# Patient Record
Sex: Female | Born: 1980 | Race: Black or African American | Hispanic: No | Marital: Single | Smoking: Current some day smoker
Health system: Southern US, Community
[De-identification: ages and names within clinical notes are randomized; demographics above are authoritative.]

## PROBLEM LIST (undated history)

## (undated) HISTORY — PX: ABDOMINAL HYSTERECTOMY: SHX81

---
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
## 1999-12-07 ENCOUNTER — Emergency Department (HOSPITAL_COMMUNITY): Admission: EM | Admit: 1999-12-07 | Discharge: 1999-12-07 | Payer: Self-pay | Admitting: Emergency Medicine

## 2000-01-07 ENCOUNTER — Emergency Department (HOSPITAL_COMMUNITY): Admission: EM | Admit: 2000-01-07 | Discharge: 2000-01-07 | Payer: Self-pay | Admitting: Emergency Medicine

## 2001-12-14 ENCOUNTER — Emergency Department (HOSPITAL_COMMUNITY): Admission: EM | Admit: 2001-12-14 | Discharge: 2001-12-15 | Payer: Self-pay | Admitting: Emergency Medicine

## 2005-01-28 ENCOUNTER — Emergency Department (HOSPITAL_COMMUNITY): Admission: EM | Admit: 2005-01-28 | Discharge: 2005-01-28 | Payer: Self-pay | Admitting: Emergency Medicine

## 2007-08-27 ENCOUNTER — Emergency Department (HOSPITAL_COMMUNITY): Admission: EM | Admit: 2007-08-27 | Discharge: 2007-08-27 | Payer: Self-pay | Admitting: Emergency Medicine

## 2008-03-21 ENCOUNTER — Emergency Department (HOSPITAL_COMMUNITY): Admission: EM | Admit: 2008-03-21 | Discharge: 2008-03-21 | Payer: Self-pay | Admitting: Family Medicine

## 2010-01-15 ENCOUNTER — Emergency Department (HOSPITAL_COMMUNITY): Admission: EM | Admit: 2010-01-15 | Discharge: 2010-01-15 | Payer: Self-pay | Admitting: Family Medicine

## 2011-02-19 LAB — POCT I-STAT, CHEM 8
BUN: 10
Calcium, Ion: 1.2
Chloride: 106
Creatinine, Ser: 1
Glucose, Bld: 100 — ABNORMAL HIGH
HCT: 46
Hemoglobin: 15.6 — ABNORMAL HIGH
Potassium: 4.2
Sodium: 140
TCO2: 24

## 2011-02-19 LAB — POCT PREGNANCY, URINE
Operator id: 239701
Preg Test, Ur: NEGATIVE

## 2011-02-25 LAB — POCT URINALYSIS DIP (DEVICE)
Glucose, UA: NEGATIVE
Ketones, ur: 80 — AB
Nitrite: NEGATIVE
Operator id: 235561
Protein, ur: 100 — AB
Specific Gravity, Urine: 1.015
Urobilinogen, UA: 0.2
pH: >=9

## 2011-02-25 LAB — POCT I-STAT, CHEM 8
BUN: 10
Calcium, Ion: 1.22
Chloride: 105
Creatinine, Ser: 1.1
Glucose, Bld: 101 — ABNORMAL HIGH
HCT: 45
Hemoglobin: 15.3 — ABNORMAL HIGH
Potassium: 4.4
Sodium: 137
TCO2: 24

## 2011-02-25 LAB — POCT PREGNANCY, URINE: Preg Test, Ur: NEGATIVE

## 2011-08-10 ENCOUNTER — Emergency Department (INDEPENDENT_AMBULATORY_CARE_PROVIDER_SITE_OTHER)
Admission: EM | Admit: 2011-08-10 | Discharge: 2011-08-10 | Disposition: A | Discharge status: Home / Self Care | Payer: Self-pay | Source: Home / Self Care | Attending: Emergency Medicine | Admitting: Emergency Medicine

## 2011-08-10 ENCOUNTER — Encounter (HOSPITAL_COMMUNITY): Payer: Self-pay

## 2011-08-10 DIAGNOSIS — J40 Bronchitis, not specified as acute or chronic: Secondary | ICD-10-CM

## 2011-08-10 DIAGNOSIS — R197 Diarrhea, unspecified: Secondary | ICD-10-CM

## 2011-08-10 MED ORDER — IBUPROFEN 800 MG PO TABS
ORAL_TABLET | ORAL | Status: AC
  Filled 2011-08-10: qty 1

## 2011-08-10 MED ORDER — AZITHROMYCIN 250 MG PO TABS: 250.0000 mg | ORAL_TABLET | Freq: Every day | ORAL | Status: AC

## 2011-08-10 MED ORDER — GUAIFENESIN-CODEINE 100-10 MG/5ML PO SYRP: 5.0000 mL | ORAL_SOLUTION | Freq: Three times a day (TID) | ORAL | Status: AC | PRN

## 2011-08-10 MED ORDER — IBUPROFEN 800 MG PO TABS
800.0000 mg | ORAL_TABLET | Freq: Once | ORAL | Status: AC
  Administered 2011-08-10: 800 mg via ORAL

## 2011-08-10 NOTE — Discharge Instructions (Signed)

## 2011-08-10 NOTE — ED Notes (Signed)
Pt states she has had a fever, chills, cough. Pt states she has taken Nyquil and rubbing vicks on her chest. Temp currently is 101.8

## 2011-08-10 NOTE — ED Provider Notes (Signed)
History     CSN: [id number]  Arrival date & time 08/10/11  1001   First MD Initiated Contact with Patient 08/10/11 1004      Chief Complaint  Patient presents with  . Fever    (Consider location/radiation/quality/duration/timing/severity/associated sxs/prior treatment) HPI Comments: As you have been having respiratory symptoms consistent of cough fever with associated chills for about one week been also present within the last 2 days some productive cough with colored sputum. Patient denies any gastrointestinal symptoms with the exception of 2 loose stools but no active vomiting, no shortness of breath  Patient is a 31 y.o. female presenting with fever. The history is provided by the patient.  Fever Primary symptoms of the febrile illness include fever and cough. Primary symptoms do not include shortness of breath or rash. This is a new problem.    History reviewed. No pertinent past medical history.  History reviewed. No pertinent past surgical history.  History reviewed. No pertinent family history.  History  Substance Use Topics  . Smoking status: Current Some Day Smoker  . Smokeless tobacco: Not on file  . Alcohol Use: No    OB History    Grav Para Term Preterm Abortions TAB SAB Ect Mult Living                  Review of Systems  Constitutional: Positive for fever, chills, activity change and appetite change. Negative for diaphoresis.  HENT: Positive for congestion and rhinorrhea.   Eyes: Negative for photophobia.  Respiratory: Positive for cough. Negative for shortness of breath.   Skin: Negative for rash.    Allergies  Review of patient's allergies indicates no known allergies.  Home Medications   Current Outpatient Rx  Name Route Sig Dispense Refill  . AZITHROMYCIN 250 MG PO TABS Oral Take 1 tablet (250 mg total) by mouth daily. Take first 2 tablets together, then 1 every day until finished. 6 tablet 0  . GUAIFENESIN-CODEINE 100-10 MG/5ML PO SYRP Oral  Take 5 mLs by mouth 3 (three) times daily as needed for cough. 120 mL 0    BP 125/81  Pulse 120  Temp(Src) 100.7 F (38.2 C) (Oral)  Resp 21  SpO2 98%  LMP 07/13/2011  Physical Exam  Nursing note and vitals reviewed. Constitutional: She appears well-developed and well-nourished. No distress.  HENT:  Head: Normocephalic.  Right Ear: Tympanic membrane normal.  Left Ear: Tympanic membrane normal.  Mouth/Throat: Uvula is midline. Posterior oropharyngeal erythema present. No oropharyngeal exudate.  Eyes: Conjunctivae are normal.  Neck: Neck supple. No JVD present.  Cardiovascular: Normal rate.   No murmur heard. Pulmonary/Chest: Effort normal. No respiratory distress. She has no decreased breath sounds. She has no wheezes. She has no rhonchi. She has no rales.  Abdominal: Soft.  Lymphadenopathy:    She has no cervical adenopathy.  Skin: Skin is warm.    ED Course  Procedures (including critical care time)  Labs Reviewed - No data to display No results found.   1. Bronchitis   2. Diarrhea       MDM  Patient with respiratory symptoms and resolved diarrhea with normal respiratory exam symptomatic management recommended        Jimmie Molly, MD 08/10/11 1326

## 2014-10-16 ENCOUNTER — Emergency Department (INDEPENDENT_AMBULATORY_CARE_PROVIDER_SITE_OTHER)
Admission: EM | Admit: 2014-10-16 | Discharge: 2014-10-16 | Disposition: A | Discharge status: Home / Self Care | Payer: Self-pay | Source: Home / Self Care | Attending: Family Medicine | Admitting: Family Medicine

## 2014-10-16 ENCOUNTER — Encounter (HOSPITAL_COMMUNITY): Payer: Self-pay | Admitting: Emergency Medicine

## 2014-10-16 DIAGNOSIS — A084 Viral intestinal infection, unspecified: Secondary | ICD-10-CM

## 2014-10-16 MED ORDER — SODIUM CHLORIDE 0.9 % IV BOLUS (SEPSIS)
500.0000 mL | Freq: Once | INTRAVENOUS | Status: AC
  Administered 2014-10-16: 500 mL via INTRAVENOUS

## 2014-10-16 MED ORDER — ONDANSETRON HCL 4 MG/2ML IJ SOLN
INTRAMUSCULAR | Status: AC
  Filled 2014-10-16: qty 2

## 2014-10-16 MED ORDER — PROMETHAZINE HCL 25 MG PO TABS: 25.0000 mg | ORAL_TABLET | Freq: Four times a day (QID) | ORAL | Status: DC | PRN

## 2014-10-16 MED ORDER — ONDANSETRON HCL 4 MG/2ML IJ SOLN
4.0000 mg | Freq: Once | INTRAMUSCULAR | Status: AC
  Administered 2014-10-16: 4 mg via INTRAVENOUS

## 2014-10-16 NOTE — ED Notes (Signed)
Patient reviewed work note

## 2014-10-16 NOTE — ED Notes (Signed)
Reports unable to keep anything down, reports vomiting x 6 and diarrhea episodes x 6 today, onset yesterday.

## 2014-10-16 NOTE — Discharge Instructions (Signed)
Thank you for coming in today. Call or go to the emergency room if you get worse, have trouble breathing, have chest pains, or palpitations.   Viral Gastroenteritis Viral gastroenteritis is also known as stomach flu. This condition affects the stomach and intestinal tract. It can cause sudden diarrhea and vomiting. The illness typically lasts 3 to 8 days. Most people develop an immune response that eventually gets rid of the virus. While this natural response develops, the virus can make you quite ill. CAUSES  Many different viruses can cause gastroenteritis, such as rotavirus or noroviruses. You can catch one of these viruses by consuming contaminated food or water. You may also catch a virus by sharing utensils or other personal items with an infected person or by touching a contaminated surface. SYMPTOMS  The most common symptoms are diarrhea and vomiting. These problems can cause a severe loss of body fluids (dehydration) and a body salt (electrolyte) imbalance. Other symptoms may include:  Fever.  Headache.  Fatigue.  Abdominal pain. DIAGNOSIS  Your caregiver can usually diagnose viral gastroenteritis based on your symptoms and a physical exam. A stool sample may also be taken to test for the presence of viruses or other infections. TREATMENT  This illness typically goes away on its own. Treatments are aimed at rehydration. The most serious cases of viral gastroenteritis involve vomiting so severely that you are not able to keep fluids down. In these cases, fluids must be given through an intravenous line (IV). HOME CARE INSTRUCTIONS   Drink enough fluids to keep your urine clear or pale yellow. Drink small amounts of fluids frequently and increase the amounts as tolerated.  Ask your caregiver for specific rehydration instructions.  Avoid:  Foods high in sugar.  Alcohol.  Carbonated drinks.  Tobacco.  Juice.  Caffeine drinks.  Extremely hot or cold fluids.  Fatty,  greasy foods.  Too much intake of anything at one time.  Dairy products until 24 to 48 hours after diarrhea stops.  You may consume probiotics. Probiotics are active cultures of beneficial bacteria. They may lessen the amount and number of diarrheal stools in adults. Probiotics can be found in yogurt with active cultures and in supplements.  Wash your hands well to avoid spreading the virus.  Only take over-the-counter or prescription medicines for pain, discomfort, or fever as directed by your caregiver. Do not give aspirin to children. Antidiarrheal medicines are not recommended.  Ask your caregiver if you should continue to take your regular prescribed and over-the-counter medicines.  Keep all follow-up appointments as directed by your caregiver. SEEK IMMEDIATE MEDICAL CARE IF:   You are unable to keep fluids down.  You do not urinate at least once every 6 to 8 hours.  You develop shortness of breath.  You notice blood in your stool or vomit. This may look like coffee grounds.  You have abdominal pain that increases or is concentrated in one small area (localized).  You have persistent vomiting or diarrhea.  You have a fever.  The patient is a child younger than 3 months, and he or she has a fever.  The patient is a child older than 3 months, and he or she has a fever and persistent symptoms.  The patient is a child older than 3 months, and he or she has a fever and symptoms suddenly get worse.  The patient is a baby, and he or she has no tears when crying. MAKE SURE YOU:   Understand these instructions.  Will  watch your condition.  Will get help right away if you are not doing well or get worse. Document Released: 05/13/2005 Document Revised: 08/05/2011 Document Reviewed: 02/27/2011 [facility] Patient Information 2015 [place], [place]. This information is not intended to replace advice given to you by your health care provider. Make sure you discuss any questions you  have with your health care provider.

## 2014-10-16 NOTE — ED Provider Notes (Signed)
Charlotte Barnes is a 34 y.o. female who presents to Urgent Care today for vomiting. Patient has 2 days of frequent vomiting. She denies any abdominal pain or diarrhea fevers or chills. She has not tried any medications yet. She feels well otherwise. She is currently menstruating. She states that she cannot be pregnant because she does not have sex with men.   History reviewed. No pertinent past medical history. History reviewed. No pertinent past surgical history. History  Substance Use Topics  . Smoking status: Current Some Day Smoker  . Smokeless tobacco: Not on file  . Alcohol Use: No   ROS as above Medications: No current facility-administered medications for this encounter.   Current Outpatient Prescriptions  Medication Sig Dispense Refill  . promethazine (PHENERGAN) 25 MG tablet Take 1 tablet (25 mg total) by mouth every 6 (six) hours as needed for nausea or vomiting. 30 tablet 0   No Known Allergies   Exam:  BP 134/77 mmHg  Pulse 101  Temp(Src) 98.2 F (36.8 C) (Oral)  Resp 16  SpO2 100%  LMP 10/16/2014 Gen: Well NAD HEENT: EOMI,  MMM Lungs: Normal work of breathing. CTABL Heart: RRR no MRG Abd: NABS, Soft. Nondistended, Nontender Exts: Brisk capillary refill, warm and well perfused.   4 mg IV Zofran given as well as a 500 mL bolus provided. She felt much better.  No results found for this or any previous visit (from the past 24 hour(s)). No results found.  Assessment and Plan: 34 y.o. female with viral gastroenteritis. Treat with Phenergan. Work note provided. Return as needed.  Discussed warning signs or symptoms. Please see discharge instructions. Patient expresses understanding.     Gregor Hams, MD 10/16/14 [phone]

## 2014-10-16 NOTE — ED Notes (Signed)
Notified dr Georgina Snell, 500cc bolus completed

## 2014-10-16 NOTE — ED Notes (Signed)
Reviewed instructions and script for phenergan

## 2015-07-04 ENCOUNTER — Emergency Department (HOSPITAL_COMMUNITY)
Admission: EM | Admit: 2015-07-04 | Discharge: 2015-07-04 | Disposition: A | Discharge status: Home / Self Care | Payer: Self-pay | Attending: Emergency Medicine | Admitting: Emergency Medicine

## 2015-07-04 ENCOUNTER — Encounter (HOSPITAL_COMMUNITY): Payer: Self-pay | Admitting: Emergency Medicine

## 2015-07-04 DIAGNOSIS — F17200 Nicotine dependence, unspecified, uncomplicated: Secondary | ICD-10-CM | POA: Insufficient documentation

## 2015-07-04 DIAGNOSIS — M25511 Pain in right shoulder: Secondary | ICD-10-CM | POA: Insufficient documentation

## 2015-07-04 NOTE — ED Notes (Signed)
Patient did not want to wait due to the wait time.

## 2015-07-04 NOTE — ED Notes (Signed)
Patient with right shoulder pain that awoke her from sleep.  Patient states that the pain is going up into her neck on the right side.  She states that she has swelling to both of her hands.  She states that she works out regularly, but this is not the soreness that she has from working out.  Patient denies any chest pain, shortness of breath or nausea.

## 2020-06-30 ENCOUNTER — Emergency Department (HOSPITAL_COMMUNITY)
Admission: EM | Admit: 2020-06-30 | Discharge: 2020-06-30 | Disposition: A | Discharge status: Home / Self Care | Payer: Commercial Managed Care - PPO | Attending: Emergency Medicine | Admitting: Emergency Medicine

## 2020-06-30 ENCOUNTER — Ambulatory Visit (HOSPITAL_COMMUNITY)
Admission: EM | Admit: 2020-06-30 | Discharge: 2020-06-30 | Disposition: A | Discharge status: Home / Self Care | Payer: Commercial Managed Care - PPO | Attending: Physician Assistant | Admitting: Physician Assistant

## 2020-06-30 ENCOUNTER — Encounter (HOSPITAL_COMMUNITY): Payer: Self-pay

## 2020-06-30 ENCOUNTER — Other Ambulatory Visit: Payer: Self-pay

## 2020-06-30 DIAGNOSIS — R1031 Right lower quadrant pain: Secondary | ICD-10-CM | POA: Diagnosis not present

## 2020-06-30 DIAGNOSIS — Z5321 Procedure and treatment not carried out due to patient leaving prior to being seen by health care provider: Secondary | ICD-10-CM | POA: Diagnosis not present

## 2020-06-30 DIAGNOSIS — R1032 Left lower quadrant pain: Secondary | ICD-10-CM | POA: Insufficient documentation

## 2020-06-30 DIAGNOSIS — R197 Diarrhea, unspecified: Secondary | ICD-10-CM | POA: Insufficient documentation

## 2020-06-30 LAB — COMPREHENSIVE METABOLIC PANEL
ALT: 20 U/L (ref 0–44)
AST: 19 U/L (ref 15–41)
Albumin: 3.5 g/dL (ref 3.5–5.0)
Alkaline Phosphatase: 62 U/L (ref 38–126)
Anion gap: 10 (ref 5–15)
BUN: 8 mg/dL (ref 6–20)
CO2: 22 mmol/L (ref 22–32)
Calcium: 9.5 mg/dL (ref 8.9–10.3)
Chloride: 103 mmol/L (ref 98–111)
Creatinine, Ser: 1.02 mg/dL — ABNORMAL HIGH (ref 0.44–1.00)
GFR, Estimated: >60 mL/min (ref >60)
Glucose, Bld: 103 mg/dL — ABNORMAL HIGH (ref 70–99)
Potassium: 4 mmol/L (ref 3.5–5.1)
Sodium: 135 mmol/L (ref 135–145)
Total Bilirubin: 0.5 mg/dL (ref 0.3–1.2)
Total Protein: 7.3 g/dL (ref 6.5–8.1)

## 2020-06-30 LAB — CBC
HCT: 42 % (ref 36.0–46.0)
Hemoglobin: 13.9 g/dL (ref 12.0–15.0)
MCH: 32.1 pg (ref 26.0–34.0)
MCHC: 33.1 g/dL (ref 30.0–36.0)
MCV: 97 fL (ref 80.0–100.0)
Platelets: 379 10*3/uL (ref 150–400)
RBC: 4.33 MIL/uL (ref 3.87–5.11)
RDW: 12.4 % (ref 11.5–15.5)
WBC: 10.9 10*3/uL — ABNORMAL HIGH (ref 4.0–10.5)
nRBC: 0 % (ref 0.0–0.2)

## 2020-06-30 LAB — I-STAT BETA HCG BLOOD, ED (MC, WL, AP ONLY): I-stat hCG, quantitative: <5 m[IU]/mL (ref <5)

## 2020-06-30 LAB — POCT URINALYSIS DIPSTICK, ED / UC
Bilirubin Urine: NEGATIVE
Glucose, UA: NEGATIVE mg/dL
Ketones, ur: NEGATIVE mg/dL
Leukocytes,Ua: NEGATIVE
Nitrite: NEGATIVE
Protein, ur: NEGATIVE mg/dL
Specific Gravity, Urine: 1.02 (ref 1.005–1.030)
Urobilinogen, UA: 1 mg/dL (ref 0.0–1.0)
pH: 6.5 (ref 5.0–8.0)

## 2020-06-30 LAB — LIPASE, BLOOD: Lipase: 32 U/L (ref 11–51)

## 2020-06-30 NOTE — ED Notes (Signed)
Pt stated that she was going out to move her car. Pt also stated that she would return.

## 2020-06-30 NOTE — ED Triage Notes (Signed)
Pt reports bilateral lower abd pain for the past week with some diarrhea. Denies any nausea, abd vaginal bleeding or discharge.

## 2020-06-30 NOTE — ED Notes (Signed)
Pt stated that she can no longer wait to be seen.

## 2020-06-30 NOTE — Discharge Instructions (Signed)
Go to the Emergency department for evaluation  

## 2020-06-30 NOTE — ED Triage Notes (Signed)
Pt presents with right lower quadrant abdominal pain x 1 week. Pain I worse when walking and driving. Denies fever, dysuria, diarrhea.

## 2020-06-30 NOTE — ED Notes (Signed)
Pt returned from moving vehicle.

## 2020-06-30 NOTE — ED Provider Notes (Signed)
[facility]    CSN: [id number] Arrival date & time: 06/30/20  0800      History   Chief Complaint Chief Complaint  Patient presents with  . Abdominal Pain    HPI Charlotte Barnes is a 40 y.o. female.   The history is provided by the patient. No language interpreter was used.  Abdominal Pain Pain location:  Suprapubic Pain quality: aching   Pain radiates to:  Does not radiate Pain severity:  Moderate Onset quality:  Gradual Timing:  Constant Progression:  Worsening Chronicity:  New Relieved by:  Nothing Worsened by:  Nothing Ineffective treatments:  None tried Associated symptoms: no fever and no vaginal discharge   Risk factors: no alcohol abuse   Pt complains of lower abdominal pain.  Pt reports she has some discomfort before periods every month but period is not due for 2 weeks.  Pt reports painful to push on lower abdomen  History reviewed. No pertinent past medical history.  There are no problems to display for this patient.   History reviewed. No pertinent surgical history.  OB History   No obstetric history on file.      Home Medications    Prior to Admission medications   Medication Sig Start Date End Date Taking? Authorizing Provider  promethazine (PHENERGAN) 25 MG tablet Take 1 tablet (25 mg total) by mouth every 6 (six) hours as needed for nausea or vomiting. 10/16/14   Gregor Hams, MD    Family History History reviewed. No pertinent family history.  Social History Social History   Tobacco Use  . Smoking status: Current Some Day Smoker  . Smokeless tobacco: Never Used  Substance Use Topics  . Alcohol use: No     Allergies   Patient has no known allergies.   Review of Systems Review of Systems  Constitutional: Negative for fever.  Gastrointestinal: Positive for abdominal pain.  Genitourinary: Negative for vaginal discharge.  All other systems reviewed and are negative.    Physical Exam Triage Vital Signs ED Triage  Vitals  Enc Vitals Group     BP 06/30/20 0858 117/70     Pulse Rate 06/30/20 0858 75     Resp 06/30/20 0858 18     Temp 06/30/20 0858 98.5 F (36.9 C)     Temp Source 06/30/20 0858 Oral     SpO2 06/30/20 0858 98 %     Weight --      Height --      Head Circumference --      Peak Flow --      Pain Score 06/30/20 0856 9     Pain Loc --      Pain Edu? --      Excl. in [place]? --    No data found.  Updated Vital Signs BP 117/70 (BP Location: Right Arm)   Pulse 75   Temp 98.5 F (36.9 C) (Oral)   Resp 18   LMP  (Within Weeks) Comment: 3 weeks  SpO2 98%   Visual Acuity Right Eye Distance:   Left Eye Distance:   Bilateral Distance:    Right Eye Near:   Left Eye Near:    Bilateral Near:     Physical Exam Vitals and nursing note reviewed.  Constitutional:      Appearance: She is well-developed and well-nourished.  HENT:     Head: Normocephalic.  Eyes:     Extraocular Movements: EOM normal.  Cardiovascular:     Rate and Rhythm:  Normal rate and regular rhythm.  Pulmonary:     Effort: Pulmonary effort is normal.  Abdominal:     General: Abdomen is flat. Bowel sounds are normal. There is no distension.     Palpations: Abdomen is soft.     Tenderness: There is abdominal tenderness in the right lower quadrant and suprapubic area.  Musculoskeletal:        General: Normal range of motion.     Cervical back: Normal range of motion.  Skin:    General: Skin is warm.  Neurological:     Mental Status: She is alert and oriented to person, place, and time.  Psychiatric:        Mood and Affect: Mood and affect normal.      UC Treatments / Results  Labs (all labs ordered are listed, but only abnormal results are displayed) Labs Reviewed  POCT URINALYSIS DIPSTICK, ED / UC - Abnormal; Notable for the following components:      Result Value   Hgb urine dipstick SMALL (*)    All other components within normal limits    EKG   Radiology No results  found.  Procedures Procedures (including critical care time)  Medications Ordered in UC Medications - No data to display  Initial Impression / Assessment and Plan / UC Course  I have reviewed the triage vital signs and the nursing notes.  Pertinent labs & imaging results that were available during my care of the patient were reviewed by me and considered in my medical decision making (see chart for details).     UA shows blood otherwise negative.   Pt counseled.  I will send to ED for evaluation  Final Clinical Impressions(s) / UC Diagnoses   Final diagnoses:  Right lower quadrant abdominal pain   Discharge Instructions   None    ED Prescriptions    None     PDMP not reviewed this encounter.  An After Visit Summary was printed and given to the patient.    Fransico Meadow, [place] 06/30/20 [phone]

## 2020-07-01 ENCOUNTER — Emergency Department (HOSPITAL_COMMUNITY)
Admission: EM | Admit: 2020-07-01 | Discharge: 2020-07-01 | Disposition: A | Discharge status: Home / Self Care | Payer: Commercial Managed Care - PPO | Attending: Emergency Medicine | Admitting: Emergency Medicine

## 2020-07-01 ENCOUNTER — Emergency Department (HOSPITAL_COMMUNITY): Payer: Commercial Managed Care - PPO

## 2020-07-01 ENCOUNTER — Encounter (HOSPITAL_COMMUNITY): Payer: Self-pay

## 2020-07-01 DIAGNOSIS — R102 Pelvic and perineal pain: Secondary | ICD-10-CM

## 2020-07-01 DIAGNOSIS — F17200 Nicotine dependence, unspecified, uncomplicated: Secondary | ICD-10-CM | POA: Diagnosis not present

## 2020-07-01 DIAGNOSIS — R1030 Lower abdominal pain, unspecified: Secondary | ICD-10-CM | POA: Diagnosis present

## 2020-07-01 DIAGNOSIS — D259 Leiomyoma of uterus, unspecified: Secondary | ICD-10-CM | POA: Insufficient documentation

## 2020-07-01 LAB — URINALYSIS, ROUTINE W REFLEX MICROSCOPIC
Bacteria, UA: NONE SEEN
Bilirubin Urine: NEGATIVE
Glucose, UA: NEGATIVE mg/dL
Ketones, ur: NEGATIVE mg/dL
Leukocytes,Ua: NEGATIVE
Nitrite: NEGATIVE
Protein, ur: NEGATIVE mg/dL
Specific Gravity, Urine: 1.017 (ref 1.005–1.030)
pH: 6 (ref 5.0–8.0)

## 2020-07-01 LAB — WET PREP, GENITAL
Clue Cells Wet Prep HPF POC: NONE SEEN
Sperm: NONE SEEN
Trich, Wet Prep: NONE SEEN
Yeast Wet Prep HPF POC: NONE SEEN

## 2020-07-01 LAB — COMPREHENSIVE METABOLIC PANEL
ALT: 19 U/L (ref 0–44)
AST: 18 U/L (ref 15–41)
Albumin: 3.8 g/dL (ref 3.5–5.0)
Alkaline Phosphatase: 63 U/L (ref 38–126)
Anion gap: 10 (ref 5–15)
BUN: 9 mg/dL (ref 6–20)
CO2: 23 mmol/L (ref 22–32)
Calcium: 9.3 mg/dL (ref 8.9–10.3)
Chloride: 103 mmol/L (ref 98–111)
Creatinine, Ser: 0.93 mg/dL (ref 0.44–1.00)
GFR, Estimated: >60 mL/min (ref >60)
Glucose, Bld: 102 mg/dL — ABNORMAL HIGH (ref 70–99)
Potassium: 4 mmol/L (ref 3.5–5.1)
Sodium: 136 mmol/L (ref 135–145)
Total Bilirubin: 0.3 mg/dL (ref 0.3–1.2)
Total Protein: 7.7 g/dL (ref 6.5–8.1)

## 2020-07-01 LAB — CBC
HCT: 41.5 % (ref 36.0–46.0)
Hemoglobin: 13.9 g/dL (ref 12.0–15.0)
MCH: 32.5 pg (ref 26.0–34.0)
MCHC: 33.5 g/dL (ref 30.0–36.0)
MCV: 97 fL (ref 80.0–100.0)
Platelets: 396 10*3/uL (ref 150–400)
RBC: 4.28 MIL/uL (ref 3.87–5.11)
RDW: 12.6 % (ref 11.5–15.5)
WBC: 9 10*3/uL (ref 4.0–10.5)
nRBC: 0 % (ref 0.0–0.2)

## 2020-07-01 LAB — I-STAT BETA HCG BLOOD, ED (MC, WL, AP ONLY): I-stat hCG, quantitative: <5 m[IU]/mL (ref <5)

## 2020-07-01 LAB — HIV ANTIBODY (ROUTINE TESTING W REFLEX): HIV Screen 4th Generation wRfx: NONREACTIVE

## 2020-07-01 LAB — RPR: RPR Ser Ql: NONREACTIVE

## 2020-07-01 LAB — LIPASE, BLOOD: Lipase: 29 U/L (ref 11–51)

## 2020-07-01 MED ORDER — MORPHINE SULFATE (PF) 4 MG/ML IV SOLN
4.0000 mg | Freq: Once | INTRAVENOUS | Status: AC
  Administered 2020-07-01: 4 mg via INTRAVENOUS
  Filled 2020-07-01: qty 1

## 2020-07-01 MED ORDER — IBUPROFEN 600 MG PO TABS: 600.0000 mg | ORAL_TABLET | Freq: Four times a day (QID) | ORAL | 0 refills | Status: AC | PRN

## 2020-07-01 NOTE — ED Provider Notes (Signed)
[facility] DEPT Provider Note   CSN: [id number] Arrival date & time: 07/01/20  0827     History Chief Complaint  Patient presents with  . Abdominal Pain    Charlotte Barnes is a 40 y.o. female.  The history is provided by the patient. No language interpreter was used.  Abdominal Pain    40 year old female presenting for evaluation of abdominal pain.  Patient report for the past 3 to 4 days she has had pain to her lower abdomen.  Pain is across her lower abdomen, sharp, crampy, waxing waning.  Also having some chills and night sweats.  She also noted that her menstruation is earlier than usual and has been ongoing for the past 2 days.  She does not complain of any fever, runny nose sneezing coughing sore throat chest pain shortness of breath nausea vomiting diarrhea loss of taste or smell.  She has not been vaccinated for COVID-19.  She denies any new sexual partner.  She practices have sex.  No history of ovarian cyst.  She was seen at urgent care 2 days ago for her complaint and states a UA was obtained but she tested negative for UTI.  She went to [facility] ER yesterday but after long wait she left.  History reviewed. No pertinent past medical history.  There are no problems to display for this patient.   History reviewed. No pertinent surgical history.   OB History   No obstetric history on file.     History reviewed. No pertinent family history.  Social History   Tobacco Use  . Smoking status: Current Some Day Smoker  . Smokeless tobacco: Never Used  Substance Use Topics  . Alcohol use: No    Home Medications Prior to Admission medications   Medication Sig Start Date End Date Taking? Authorizing Provider  promethazine (PHENERGAN) 25 MG tablet Take 1 tablet (25 mg total) by mouth every 6 (six) hours as needed for nausea or vomiting. 10/16/14   Gregor Hams, MD    Allergies    Patient has no known allergies.  Review of Systems    Review of Systems  Gastrointestinal: Positive for abdominal pain.  All other systems reviewed and are negative.   Physical Exam Updated Vital Signs BP 129/87 (BP Location: Left Arm)   Pulse 99   Temp 98 F (36.7 C) (Oral)   Resp 18   LMP 06/10/2020 (Approximate)   SpO2 100%   Physical Exam Vitals and nursing note reviewed.  Constitutional:      General: She is not in acute distress.    Appearance: She is well-developed and well-nourished.  HENT:     Head: Atraumatic.  Eyes:     Conjunctiva/sclera: Conjunctivae normal.  Cardiovascular:     Rate and Rhythm: Normal rate and regular rhythm.     Heart sounds: Normal heart sounds.  Pulmonary:     Effort: Pulmonary effort is normal.     Breath sounds: Normal breath sounds.  Abdominal:     General: Abdomen is flat. Bowel sounds are normal.     Palpations: Abdomen is soft.     Tenderness: There is abdominal tenderness (Generalized abdominal tenderness without guarding or rebound tenderness.).  Musculoskeletal:     Cervical back: Neck supple.  Skin:    Findings: No rash.  Neurological:     Mental Status: She is alert.  Psychiatric:        Mood and Affect: Mood and affect normal.  ED Results / Procedures / Treatments   Labs (all labs ordered are listed, but only abnormal results are displayed) Labs Reviewed  WET PREP, GENITAL - Abnormal; Notable for the following components:      Result Value   WBC, Wet Prep HPF POC FEW (*)    All other components within normal limits  COMPREHENSIVE METABOLIC PANEL - Abnormal; Notable for the following components:   Glucose, Bld 102 (*)    All other components within normal limits  URINALYSIS, ROUTINE W REFLEX MICROSCOPIC - Abnormal; Notable for the following components:   Hgb urine dipstick SMALL (*)    All other components within normal limits  LIPASE, BLOOD  CBC  RPR  HIV ANTIBODY (ROUTINE TESTING W REFLEX)  I-STAT BETA HCG BLOOD, ED ([facility], [facility], [facility] ONLY)  GC/CHLAMYDIA PROBE  AMP ([place]) NOT AT [facility]    EKG None  Radiology US PELVIC COMPLETE W TRANSVAGINAL AND TORSION R/O  Result Date: 07/01/2020 CLINICAL DATA:  Pelvic pain. EXAM: TRANSABDOMINAL AND TRANSVAGINAL ULTRASOUND OF PELVIS DOPPLER ULTRASOUND OF OVARIES TECHNIQUE: Both transabdominal and transvaginal ultrasound examinations of the pelvis were performed. Transabdominal technique was performed for global imaging of the pelvis including uterus, ovaries, adnexal regions, and pelvic cul-de-sac. It was necessary to proceed with endovaginal exam following the transabdominal exam to visualize the endometrium and ovaries. Color and duplex Doppler ultrasound was utilized to evaluate blood flow to the ovaries. COMPARISON:  None. FINDINGS: Uterus Measurements: 15.9 x 6.4 x 6.0 cm = volume: 323 mL. Probable large fibroid is noted in lower uterine segment measuring 6.7 x 6.6 x 5.7 cm. Smaller fibroid is noted anteriorly in the fundus measuring 2.6 cm. Endometrium Thickness: 7 mm which is within normal limits. No focal abnormality visualized. Right ovary Measurements: 2.6 x 2.0 x 1.7 cm = volume: 4 mL. Normal appearance/no adnexal mass. Left ovary Measurements: 2.0 x 1.7 x 1.7 cm = volume: 3 mL. Normal appearance/no adnexal mass. Pulsed Doppler evaluation of both ovaries demonstrates normal low-resistance arterial and venous waveforms. Other findings No abnormal free fluid. IMPRESSION: 6.7 cm rounded mass is noted in lower uterine segment most consistent with uterine fibroid, although cervical mass cannot be excluded; correlation with physical exam is recommended. There is no evidence of ovarian mass or torsion. Electronically Signed   By: Marijo Conception M.D.   On: 07/01/2020 10:47    Procedures Pelvic exam  Date/Time: 07/01/2020 9:40 AM Performed by: Domenic Moras, PA-C Authorized by: Domenic Moras, PA-C  Consent: Verbal consent obtained. Risks and benefits: risks, benefits and alternatives were discussed Consent given by:  patient Patient understanding: patient states understanding of the procedure being performed Patient identity confirmed: verbally with patient Local anesthesia used: no  Anesthesia: Local anesthesia used: no  Sedation: Patient sedated: no  Comments: Hailey, NT available to chaperone.  No inguinal lymphadenopathy or inguinal hernia noted.  Normal external genitalia.  Significant discomfort with speculum insertion.  Unable to visualize cervical os due to discomfort.  Vaginal vault with normal appearance, minimal vaginal discharge noted no blood noted.  On bimanual examination, examination is limited due to discomfort but patient does have a right adnexal tenderness.  Unsure of cervical motion tenderness due to limited exam.      Medications Ordered in ED Medications  morphine 4 MG/ML injection 4 mg (4 mg Intravenous Given 07/01/20 8099)    ED Course  I have reviewed the triage vital signs and the nursing notes.  Pertinent labs & imaging results that were available  during my care of the patient were reviewed by me and considered in my medical decision making (see chart for details).    MDM Rules/Calculators/A&P                          BP (!) 124/94   Pulse 77   Temp 98 F (36.7 C) (Oral)   Resp 16   LMP 06/10/2020 (Approximate)   SpO2 100%   Final Clinical Impression(s) / ED Diagnoses Final diagnoses:  Uterine leiomyoma, unspecified location    Rx / DC Orders ED Discharge Orders    None     9:10 AM Patient here with low abdominal discomfort as well as dysmenorrhea. She practice same sex.  She has diffuse abdominal discomfort on exam but no peritonitis.  It was difficult to perform pelvic exam as patient is unable to tolerate speculum insertion as well as bimanual exam.  Due to increasing pain it would be reasonable to obtain a pelvic ultrasound to rule out torsion, TOA, or other acute pathology.  Pain medication given.  12:06 PM UA without signs of urinary tract  infection.  Wet prep unremarkable, pregnancy test is negative, labs are reassuring, transvaginal pelvic ultrasound demonstrate a 6.7 cm round mass noted to the lower uterine uterine segment most consistent with uterine fibroid, although cervical mass cannot be excluded.  Due to limited pelvic examination I am unable to fully appreciate her cervical vault.  At this time I recommend alternate between Tylenol and ibuprofen as well as follow-up closely with gynecology for outpatient evaluation.  Return precaution given.  Low suspicion for STI.  Care discussed with Dr. Langston Masker.    Domenic Moras, PA-C 07/01/20 1210    Wyvonnia Dusky, MD 07/01/20 [phone]

## 2020-07-01 NOTE — ED Triage Notes (Signed)
Pt presents with c/o abdominal pain for one week. Pt reports some diarrhea, no vomiting.

## 2020-07-01 NOTE — Discharge Instructions (Signed)
You have been evaluated for your pelvic pain.  There is a 6.7 cm uterine fibroid likely causing your pain and vaginal bleeding.  Please follow-up closely with gynecology for further care.  Alternate between Tylenol and ibuprofen as needed for pain.

## 2020-07-03 LAB — GC/CHLAMYDIA PROBE AMP (~~LOC~~) NOT AT ARMC
Chlamydia: NEGATIVE
Comment: NEGATIVE
Comment: NORMAL
Neisseria Gonorrhea: NEGATIVE

## 2022-05-22 IMAGING — US US PELVIS COMPLETE TRANSABD/TRANSVAG W DUPLEX
1 series · 13 of 25 positions shown · non-contrast
Comparison: None.

CLINICAL DATA: Pelvic pain.

EXAM:
TRANSABDOMINAL AND TRANSVAGINAL ULTRASOUND OF PELVIS
DOPPLER ULTRASOUND OF OVARIES
TECHNIQUE: Both transabdominal and transvaginal ultrasound examinations of the
pelvis were performed. Transabdominal technique was performed for
global imaging of the pelvis including uterus, ovaries, adnexal
regions, and pelvic cul-de-sac.
It was necessary to proceed with endovaginal exam following the
transabdominal exam to visualize the endometrium and ovaries. Color
and duplex Doppler ultrasound was utilized to evaluate blood flow to
the ovaries.

[Series 1: us pelvis complete transabd/transvag w duplex · 13 of 65 slices shown]
[im 1/65]
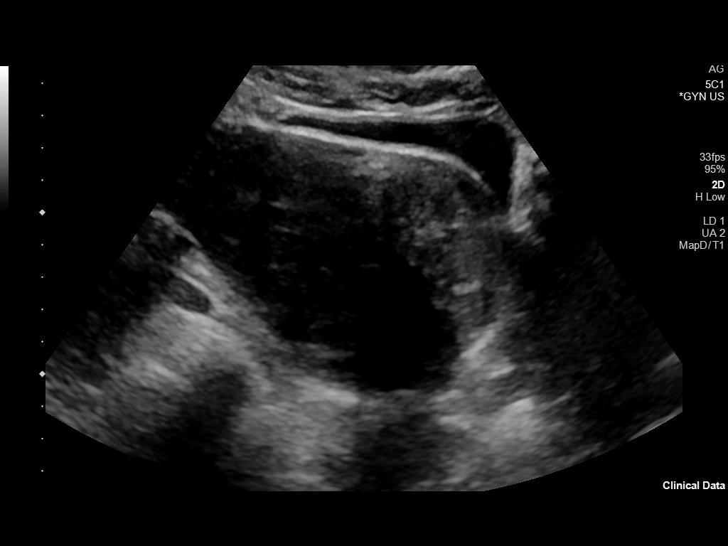
[im 6/65]
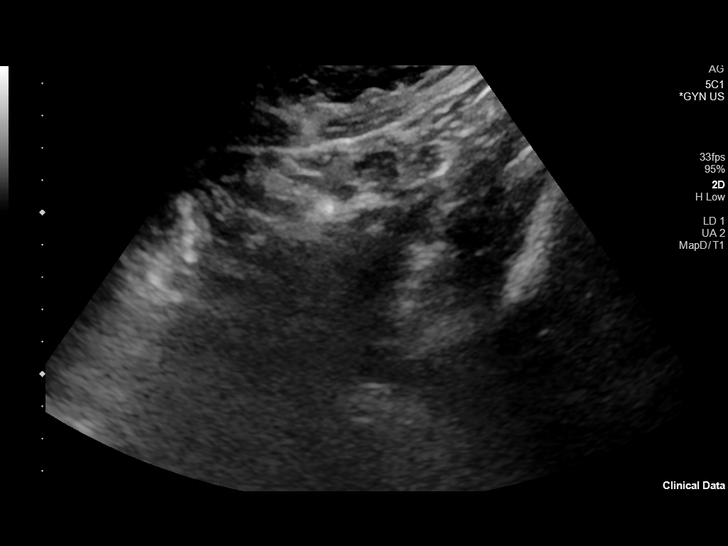
[im 11/65]
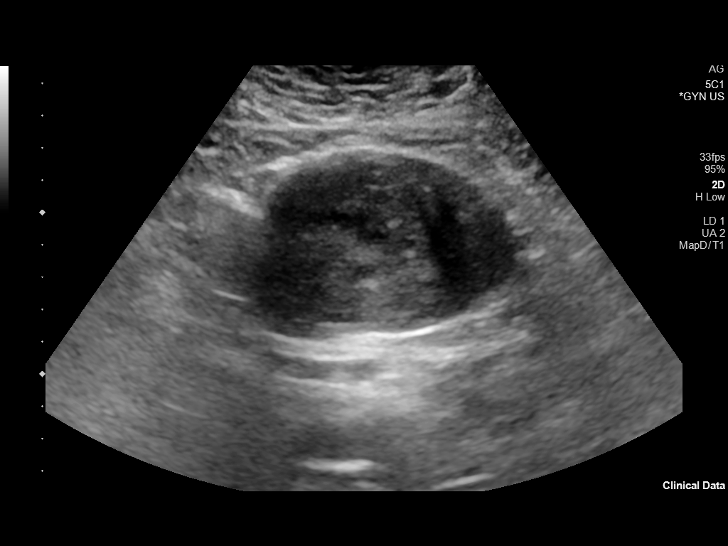
[im 17/65]
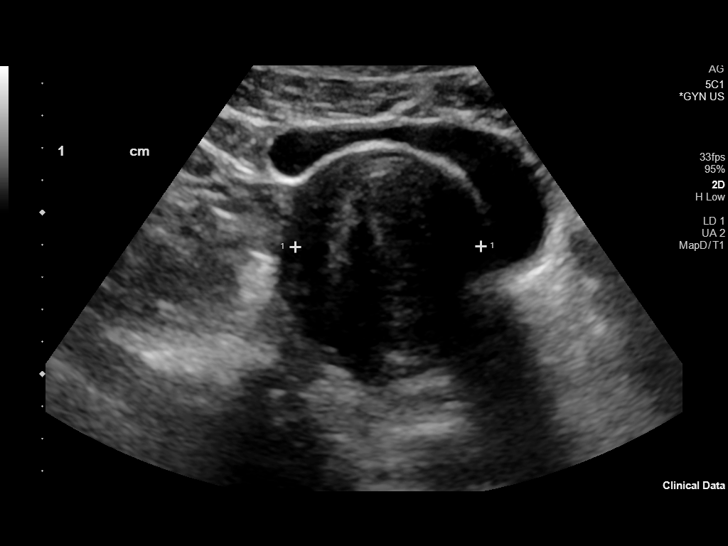
[im 22/65]
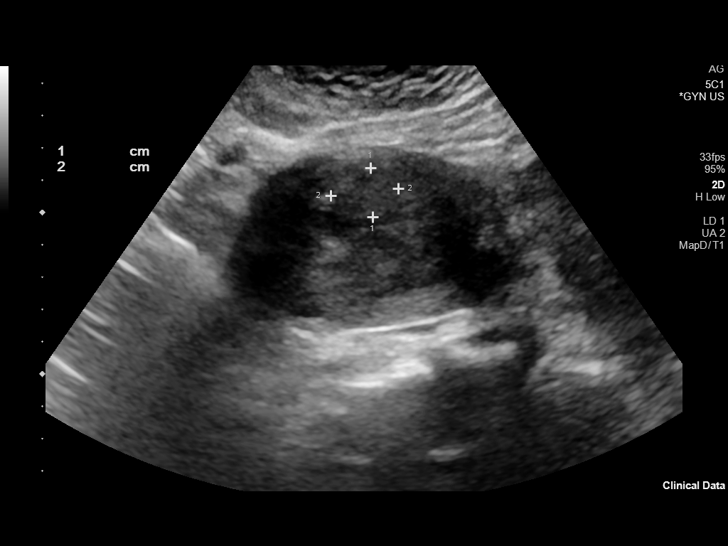
[im 27/65]
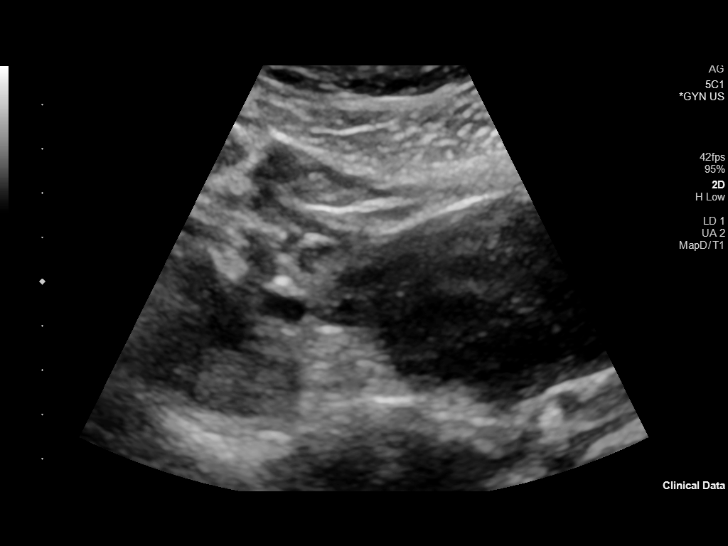
[im 33/65]
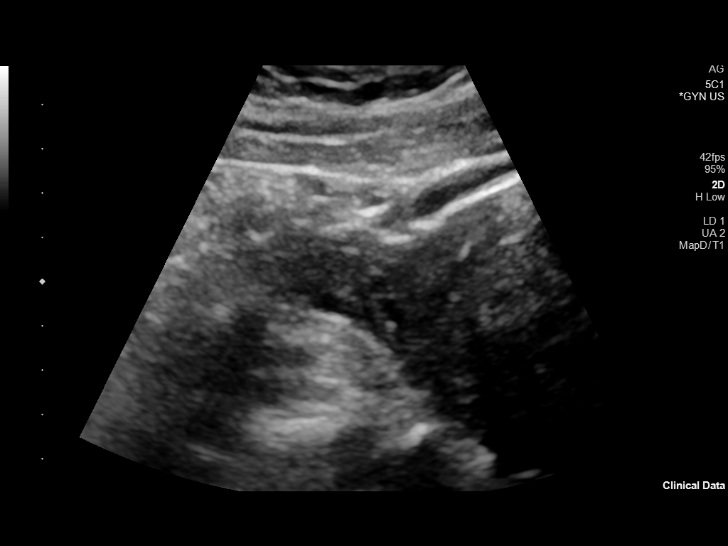
[im 38/65]
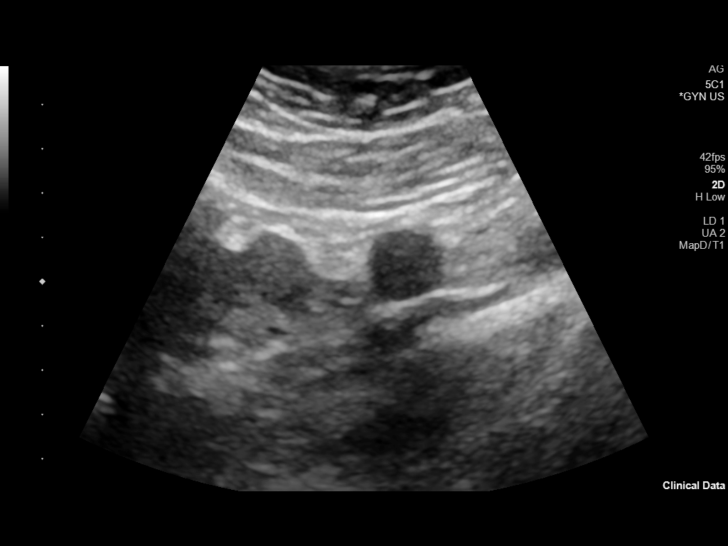
[im 43/65]
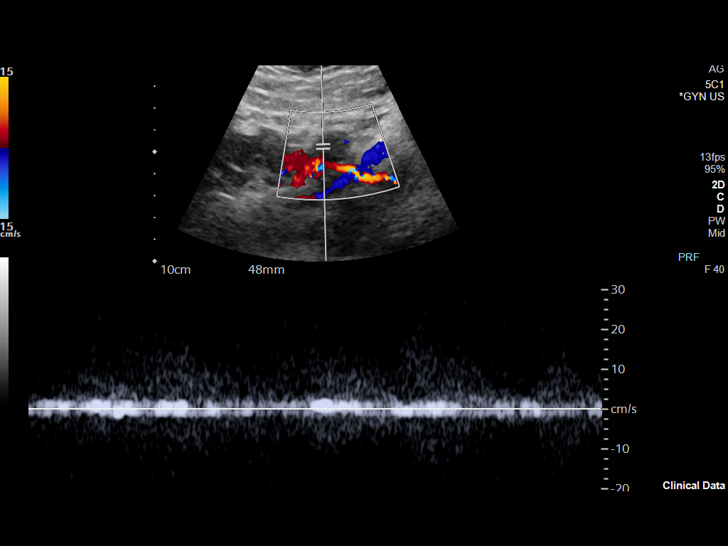
[im 49/65]
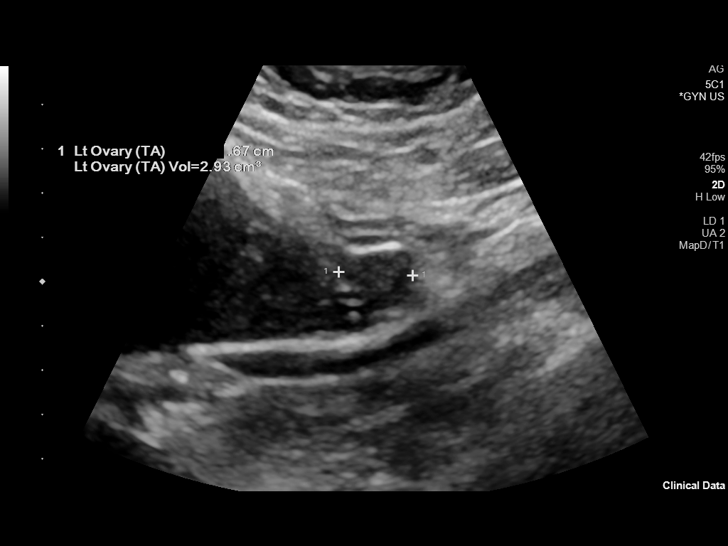
[im 54/65]
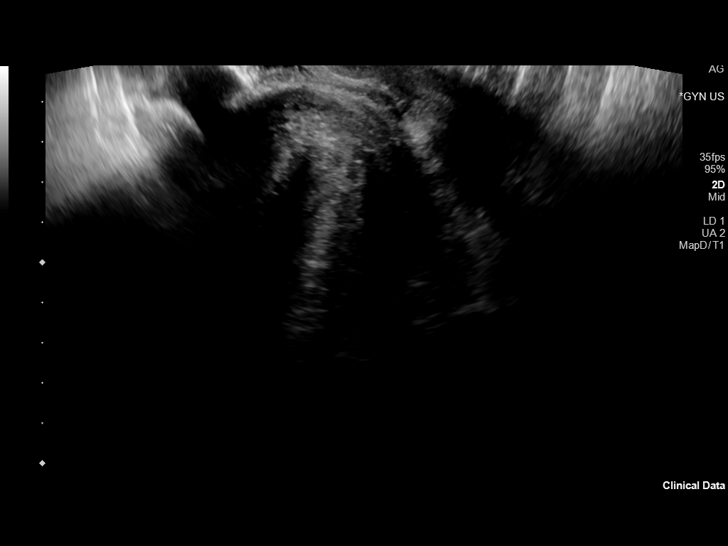
[im 59/65]
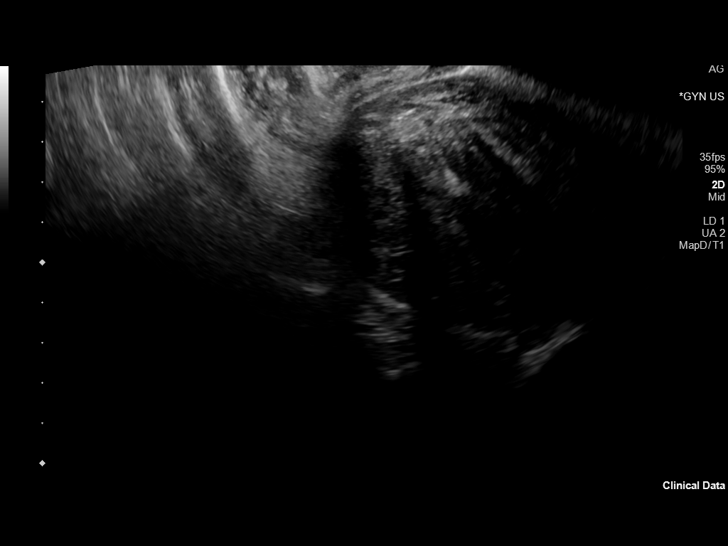
[im 65/65]
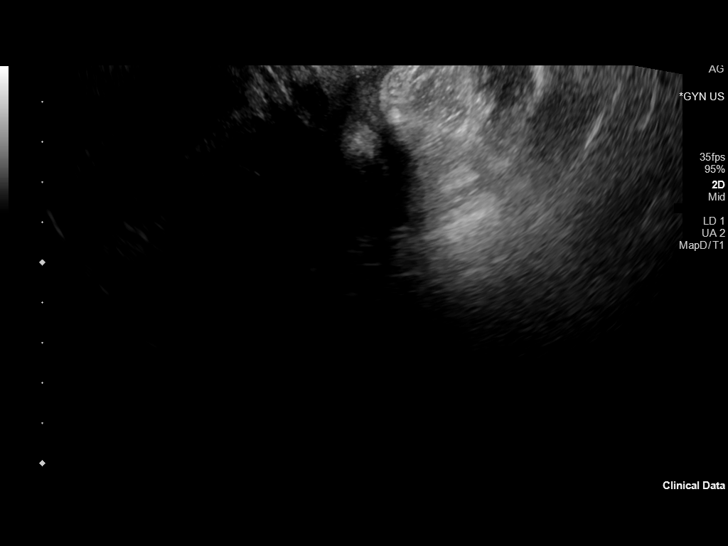

[13 of 25 positions shown; findings below may reference images not displayed]

FINDINGS: Uterus

Measurements: 15.9 x 6.4 x 6.0 cm = volume: 323 mL. Probable large
fibroid is noted in lower uterine segment measuring 6.7 x 6.6 x
cm. Smaller fibroid is noted anteriorly in the fundus measuring
cm.

Endometrium

Thickness: 7 mm which is within normal limits. No focal abnormality
visualized.

Right ovary

Measurements: 2.6 x 2.0 x 1.7 cm = volume: 4 mL. Normal
appearance/no adnexal mass.

Left ovary

Measurements: 2.0 x 1.7 x 1.7 cm = volume: 3 mL. Normal
appearance/no adnexal mass.

Pulsed Doppler evaluation of both ovaries demonstrates normal
low-resistance arterial and venous waveforms.

Other findings

No abnormal free fluid.
IMPRESSION: 6.7 cm rounded mass is noted in lower uterine segment most
consistent with uterine fibroid, although cervical mass cannot be
excluded; correlation with physical exam is recommended. There is no
evidence of ovarian mass or torsion.

## 2022-07-19 ENCOUNTER — Ambulatory Visit (HOSPITAL_COMMUNITY)
Admission: EM | Admit: 2022-07-19 | Discharge: 2022-07-19 | Disposition: A | Discharge status: Home / Self Care | Payer: Commercial Managed Care - PPO | Attending: Emergency Medicine | Admitting: Emergency Medicine

## 2022-07-19 ENCOUNTER — Encounter (HOSPITAL_COMMUNITY): Payer: Self-pay

## 2022-07-19 DIAGNOSIS — R197 Diarrhea, unspecified: Secondary | ICD-10-CM

## 2022-07-19 DIAGNOSIS — B349 Viral infection, unspecified: Secondary | ICD-10-CM

## 2022-07-19 MED ORDER — GUAIFENESIN ER 600 MG PO TB12: 600.0000 mg | ORAL_TABLET | Freq: Two times a day (BID) | ORAL | 0 refills | Status: AC

## 2022-07-19 MED ORDER — FLUTICASONE PROPIONATE 50 MCG/ACT NA SUSP: 1.0000 | Freq: Every day | NASAL | 2 refills | Status: AC

## 2022-07-19 NOTE — ED Provider Notes (Signed)
[place]    CSN: [id number] Arrival date & time: 07/19/22  0941      History   Chief Complaint Chief Complaint  Patient presents with   Generalized Body Aches   Cough   Diarrhea    HPI Charlotte Barnes is a 42 y.o. female.  Presents with 3-day history of sinus congestion and pressure, body aches, diarrhea.  A little productive cough No fevers Denies abdominal pain, nausea, vomiting She is taking Sudafed, no other medicines  Works as a Recruitment consultant, many sick contacts  History reviewed. No pertinent past medical history.  There are no problems to display for this patient.   Past Surgical History:  Procedure Laterality Date   ABDOMINAL HYSTERECTOMY      OB History   No obstetric history on file.      Home Medications    Prior to Admission medications   Medication Sig Start Date End Date Taking? Authorizing Provider  fluticasone (FLONASE) 50 MCG/ACT nasal spray Place 1 spray into both nostrils daily. 07/19/22  Yes Cayce Quezada, Wells Guiles, PA-C  guaiFENesin (MUCINEX) 600 MG 12 hr tablet Take 1 tablet (600 mg total) by mouth 2 (two) times daily for 5 days. 07/19/22 07/24/22 Yes Etter Royall, Wells Guiles, PA-C  acetaminophen (TYLENOL) 500 MG tablet Take 1,000 mg by mouth every 6 (six) hours as needed.    [provider]  ibuprofen (ADVIL) 600 MG tablet Take 1 tablet (600 mg total) by mouth every 6 (six) hours as needed. 07/01/20   Domenic Moras, PA-C  vitamin B-12 (CYANOCOBALAMIN) 1000 MCG tablet Take 1,000 mcg by mouth daily.    [provider]  promethazine (PHENERGAN) 25 MG tablet Take 1 tablet (25 mg total) by mouth every 6 (six) hours as needed for nausea or vomiting. Patient not taking: Reported on 07/01/2020 10/16/14 07/01/20  Gregor Hams, MD    Family History History reviewed. No pertinent family history.  Social History Social History   Tobacco Use   Smoking status: Some Days   Smokeless tobacco: Never  Substance Use Topics   Alcohol use: No      Allergies   Patient has no known allergies.   Review of Systems Review of Systems As per HPI  Physical Exam Triage Vital Signs ED Triage Vitals  Enc Vitals Group     BP 07/19/22 1001 (!) 141/95     Pulse Rate 07/19/22 1003 85     Resp 07/19/22 1001 16     Temp 07/19/22 1001 98.5 F (36.9 C)     Temp Source 07/19/22 1001 Oral     SpO2 07/19/22 1001 98 %     Weight --      Height --      Head Circumference --      Peak Flow --      Pain Score --      Pain Loc --      Pain Edu? --      Excl. in [place]? --    No data found.  Updated Vital Signs BP (!) 141/95 (BP Location: Left Arm)   Pulse 85   Temp 98.5 F (36.9 C) (Oral)   Resp 16   SpO2 98%    Physical Exam Vitals and nursing note reviewed.  Constitutional:      General: She is not in acute distress. HENT:     Right Ear: Tympanic membrane and ear canal normal.     Left Ear: Tympanic membrane and ear canal normal.  Nose: Congestion present. No rhinorrhea.     Right Turbinates: Swollen.     Left Turbinates: Swollen.     Mouth/Throat:     Mouth: Mucous membranes are moist.     Pharynx: Oropharynx is clear. No posterior oropharyngeal erythema.  Eyes:     Conjunctiva/sclera: Conjunctivae normal.  Cardiovascular:     Rate and Rhythm: Normal rate and regular rhythm.     Pulses: Normal pulses.     Heart sounds: Normal heart sounds.  Pulmonary:     Effort: Pulmonary effort is normal.     Breath sounds: Normal breath sounds.  Abdominal:     Tenderness: There is no abdominal tenderness.  Musculoskeletal:     Cervical back: Normal range of motion.  Lymphadenopathy:     Cervical: No cervical adenopathy.  Skin:    General: Skin is warm and dry.  Neurological:     Mental Status: She is alert and oriented to person, place, and time.     UC Treatments / Results  Labs (all labs ordered are listed, but only abnormal results are displayed) Labs Reviewed - No data to display  EKG  Radiology No  results found.  Procedures Procedures (including critical care time)  Medications Ordered in UC Medications - No data to display  Initial Impression / Assessment and Plan / UC Course  I have reviewed the triage vital signs and the nursing notes.  Pertinent labs & imaging results that were available during my care of the patient were reviewed by me and considered in my medical decision making (see chart for details).  Afebrile, well-appearing Discussed viral etiology, symptomatic care Work note provided Return precautions discussed. Patient agrees to plan  Final Clinical Impressions(s) / UC Diagnoses   Final diagnoses:  Viral illness  Diarrhea, unspecified type     Discharge Instructions      I recommend mucinex twice daily, with LOTS of fluids! Add once daily flonase to reduce sinus congestion and pressure Tylenol or ibuprofen for aches  If you want to try Afrin nasal spray, just ONCE daily, for only 3-4 days, this can help open the sinuses  Try some foods to firm the stool (attached) Diarrhea should clear on its own - stay hydrated!    ED Prescriptions     Medication Sig Dispense Auth. Provider   guaiFENesin (MUCINEX) 600 MG 12 hr tablet Take 1 tablet (600 mg total) by mouth 2 (two) times daily for 5 days. 10 tablet Ajdin Macke, PA-C   fluticasone (FLONASE) 50 MCG/ACT nasal spray Place 1 spray into both nostrils daily. 9.9 mL Traeton Bordas, Wells Guiles, PA-C      PDMP not reviewed this encounter.   Earl Zellmer, Wells Guiles, [place] 07/19/22 1043

## 2022-07-19 NOTE — ED Triage Notes (Signed)
Pt presents to the office for diarrhea,nasal congestion and cough. Pt reports she is taking sudafed.

## 2022-07-19 NOTE — Discharge Instructions (Signed)
I recommend mucinex twice daily, with LOTS of fluids! Add once daily flonase to reduce sinus congestion and pressure Tylenol or ibuprofen for aches  If you want to try Afrin nasal spray, just ONCE daily, for only 3-4 days, this can help open the sinuses  Try some foods to firm the stool (attached) Diarrhea should clear on its own - stay hydrated!
